# Patient Record
Sex: Female | Born: 1970
Health system: Southern US, Community
[De-identification: ages and names within clinical notes are randomized; demographics above are authoritative.]

## PROBLEM LIST (undated history)

## (undated) DIAGNOSIS — R221 Localized swelling, mass and lump, neck: Secondary | ICD-10-CM

## (undated) HISTORY — PX: BACK SURGERY: SHX140

---

## 2013-09-28 ENCOUNTER — Other Ambulatory Visit (HOSPITAL_COMMUNITY)
Admission: RE | Admit: 2013-09-28 | Discharge: 2013-09-28 | Disposition: A | Discharge status: Home / Self Care | Payer: BC Managed Care – PPO | Source: Ambulatory Visit | Attending: Family Medicine | Admitting: Family Medicine

## 2013-09-28 DIAGNOSIS — Z01419 Encounter for gynecological examination (general) (routine) without abnormal findings: Secondary | ICD-10-CM | POA: Insufficient documentation

## 2013-10-01 ENCOUNTER — Other Ambulatory Visit: Payer: Self-pay | Admitting: Family Medicine

## 2013-10-01 DIAGNOSIS — N63 Unspecified lump in unspecified breast: Secondary | ICD-10-CM

## 2013-10-01 DIAGNOSIS — Z803 Family history of malignant neoplasm of breast: Secondary | ICD-10-CM

## 2013-10-08 ENCOUNTER — Other Ambulatory Visit: Payer: Self-pay | Admitting: Family Medicine

## 2013-10-08 ENCOUNTER — Ambulatory Visit
Admission: RE | Admit: 2013-10-08 | Discharge: 2013-10-08 | Disposition: A | Discharge status: Home / Self Care | Payer: BC Managed Care – PPO | Source: Ambulatory Visit | Attending: Family Medicine | Admitting: Family Medicine

## 2013-10-08 DIAGNOSIS — N6320 Unspecified lump in the left breast, unspecified quadrant: Secondary | ICD-10-CM

## 2013-10-08 DIAGNOSIS — Z803 Family history of malignant neoplasm of breast: Secondary | ICD-10-CM

## 2013-10-08 DIAGNOSIS — N63 Unspecified lump in unspecified breast: Secondary | ICD-10-CM

## 2014-06-14 ENCOUNTER — Other Ambulatory Visit: Payer: Self-pay | Admitting: Family Medicine

## 2014-06-14 DIAGNOSIS — M545 Low back pain: Secondary | ICD-10-CM

## 2014-06-14 DIAGNOSIS — M5430 Sciatica, unspecified side: Secondary | ICD-10-CM

## 2014-06-22 ENCOUNTER — Ambulatory Visit
Admission: RE | Admit: 2014-06-22 | Discharge: 2014-06-22 | Disposition: A | Discharge status: Home / Self Care | Payer: BC Managed Care – PPO | Source: Ambulatory Visit | Attending: Family Medicine | Admitting: Family Medicine

## 2014-06-22 DIAGNOSIS — M545 Low back pain: Secondary | ICD-10-CM

## 2014-06-22 DIAGNOSIS — M5430 Sciatica, unspecified side: Secondary | ICD-10-CM

## 2014-10-07 ENCOUNTER — Other Ambulatory Visit: Payer: Self-pay

## 2014-10-07 DIAGNOSIS — Z1231 Encounter for screening mammogram for malignant neoplasm of breast: Secondary | ICD-10-CM

## 2014-10-10 ENCOUNTER — Encounter (INDEPENDENT_AMBULATORY_CARE_PROVIDER_SITE_OTHER): Payer: Self-pay

## 2014-10-10 ENCOUNTER — Ambulatory Visit
Admission: RE | Admit: 2014-10-10 | Discharge: 2014-10-10 | Disposition: A | Discharge status: Home / Self Care | Payer: BLUE CROSS/BLUE SHIELD | Source: Ambulatory Visit

## 2014-10-10 DIAGNOSIS — Z1231 Encounter for screening mammogram for malignant neoplasm of breast: Secondary | ICD-10-CM

## 2015-09-09 ENCOUNTER — Other Ambulatory Visit: Payer: Self-pay

## 2015-09-09 DIAGNOSIS — Z1231 Encounter for screening mammogram for malignant neoplasm of breast: Secondary | ICD-10-CM

## 2015-10-13 ENCOUNTER — Ambulatory Visit
Admission: RE | Admit: 2015-10-13 | Discharge: 2015-10-13 | Disposition: A | Discharge status: Home / Self Care | Payer: BLUE CROSS/BLUE SHIELD | Source: Ambulatory Visit

## 2015-10-13 DIAGNOSIS — Z1231 Encounter for screening mammogram for malignant neoplasm of breast: Secondary | ICD-10-CM

## 2016-07-27 DIAGNOSIS — R221 Localized swelling, mass and lump, neck: Secondary | ICD-10-CM | POA: Insufficient documentation

## 2016-08-24 ENCOUNTER — Other Ambulatory Visit: Payer: Self-pay | Admitting: Otolaryngology

## 2016-08-24 DIAGNOSIS — R221 Localized swelling, mass and lump, neck: Secondary | ICD-10-CM

## 2016-09-07 ENCOUNTER — Ambulatory Visit
Admission: RE | Admit: 2016-09-07 | Discharge: 2016-09-07 | Disposition: A | Discharge status: Home / Self Care | Payer: BLUE CROSS/BLUE SHIELD | Source: Ambulatory Visit | Attending: Otolaryngology | Admitting: Otolaryngology

## 2016-09-07 DIAGNOSIS — R221 Localized swelling, mass and lump, neck: Secondary | ICD-10-CM

## 2016-09-07 MED ORDER — IOPAMIDOL (ISOVUE-300) INJECTION 61%
75.0000 mL | Freq: Once | INTRAVENOUS | Status: AC | PRN
  Administered 2016-09-07: 75 mL via INTRAVENOUS

## 2016-09-20 ENCOUNTER — Other Ambulatory Visit: Payer: Self-pay | Admitting: Family Medicine

## 2016-09-20 DIAGNOSIS — Z1231 Encounter for screening mammogram for malignant neoplasm of breast: Secondary | ICD-10-CM

## 2016-09-24 ENCOUNTER — Other Ambulatory Visit: Payer: Self-pay | Admitting: Surgery

## 2016-09-24 DIAGNOSIS — R221 Localized swelling, mass and lump, neck: Secondary | ICD-10-CM

## 2016-09-29 ENCOUNTER — Ambulatory Visit (HOSPITAL_COMMUNITY)
Admission: RE | Admit: 2016-09-29 | Discharge: 2016-09-29 | Disposition: A | Discharge status: Home / Self Care | Payer: BLUE CROSS/BLUE SHIELD | Source: Ambulatory Visit | Attending: Surgery | Admitting: Surgery

## 2016-09-29 DIAGNOSIS — R221 Localized swelling, mass and lump, neck: Secondary | ICD-10-CM | POA: Diagnosis not present

## 2016-09-29 LAB — VAS US CAROTID
LEFT ECA DIAS: -9 cm/s
Left CCA dist dias: -24 cm/s
Left CCA dist sys: -115 cm/s
Left CCA prox dias: 36 cm/s
Left CCA prox sys: 146 cm/s
Left ICA dist dias: -27 cm/s
Left ICA dist sys: -81 cm/s
Left ICA prox dias: -20 cm/s
Left ICA prox sys: -61 cm/s
RIGHT CCA MID DIAS: 35 cm/s
RIGHT ECA DIAS: -14 cm/s
Right CCA prox dias: 28 cm/s
Right CCA prox sys: 104 cm/s
Right cca dist sys: -98 cm/s

## 2016-10-01 ENCOUNTER — Encounter: Payer: BLUE CROSS/BLUE SHIELD | Admitting: Surgery

## 2016-10-08 ENCOUNTER — Encounter: Payer: Self-pay | Admitting: Surgery

## 2016-10-13 ENCOUNTER — Encounter: Payer: Self-pay | Admitting: Surgery

## 2016-10-13 ENCOUNTER — Ambulatory Visit (INDEPENDENT_AMBULATORY_CARE_PROVIDER_SITE_OTHER): Payer: BLUE CROSS/BLUE SHIELD | Admitting: Surgery

## 2016-10-13 VITALS — BP 109/76 | HR 76 | Temp 97.0°F | Resp 18 | Ht 64.0 in | Wt 152.4 lb

## 2016-10-13 DIAGNOSIS — D446 Neoplasm of uncertain behavior of carotid body: Secondary | ICD-10-CM

## 2016-10-13 NOTE — Progress Notes (Signed)
[facility]  Patient name: Emily Rush MRN: [id number] DOB: July 20, 1971 Sex: female   REFERRING PROVIDER:    Dr. Redmond Baseman   REASON FOR CONSULT:    Carotid Body tummor  HISTORY OF PRESENT ILLNESS:   Emily Rush is a 46 y.o. female, who is here today for evaluation of a right sided carotid body tumor.  The patient first noticed a mass approximately 1 year ago.  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  She is here today to discuss removal.  She denies any symptoms.    PAST MEDICAL HISTORY   Lower back pain, s/p microdiscectomy (L5-S1) Varicose Veins, Bilateral LE  FAMILY HISTORY   Dad, brother, Type 2 DM NO CAD  SOCIAL HISTORY:   Social History   Social History  . Marital status: Single    Spouse name: N/A  . Number of children: N/A  . Years of education: N/A   Occupational History  . Not on file.   Social History Main Topics  . Smoking status: Former Smoker    Quit date: 10/13/1996  . Smokeless tobacco: Never Used  . Alcohol use Yes  . Drug use: No  . Sexual activity: Not on file   Other Topics Concern  . Not on file   Social History Narrative  . No narrative on file    ALLERGIES:    No Known Allergies  CURRENT MEDICATIONS:   Multi vitamins   REVIEW OF SYSTEMS:   [X]  denotes positive finding, [ ]  denotes negative finding Cardiac  Comments:  Chest pain or chest pressure:    Shortness of breath upon exertion:    Short of breath when lying flat:    Irregular heart rhythm:        Vascular    Pain in calf, thigh, or hip brought on by ambulation:    Pain in feet at night that wakes you up from your sleep:     Blood clot in your veins:    Leg swelling:         Pulmonary    Oxygen at home:    Productive cough:     Wheezing:         Neurologic    Sudden weakness in arms or legs:     Sudden  numbness in arms or legs:     Sudden onset of difficulty speaking or slurred speech:    Temporary loss of vision in one eye:     Problems with dizziness:         Gastrointestinal    Blood in stool:      Vomited blood:         Genitourinary    Burning when urinating:     Blood in urine:        Psychiatric    Major depression:         Hematologic    Bleeding problems:    Problems with blood clotting too easily:        Skin    Rashes or ulcers:        Constitutional    Fever or chills:     PHYSICAL EXAM:   Vitals:   10/13/16 0859  BP: 109/76  Pulse: 76  Resp: 18  Temp: 97 F (36.1 C)  TempSrc: Oral  SpO2: 98%  Weight: 152 lb 6.4 oz (69.1 kg)  Height: 5\' 4"  (1.626  m)    GENERAL: The patient is a well-nourished female, in no acute distress. The vital signs are documented above. CARDIAC: There is a regular rate and rhythm.  VASCULAR: No carotid bruit.  Nonmobile mass in the right neck PULMONARY: Nonlabored respirations  MUSCULOSKELETAL: There are no major deformities or cyanosis. NEUROLOGIC: No focal weakness or paresthesias are detected. SKIN: There are no ulcers or rashes noted. PSYCHIATRIC: The patient has a normal affect.  STUDIES:   I have reviewed her CT scan which shows a right-sided paraganglioma measuring approximately 3 x 2 cm  ASSESSMENT and PLAN   Right carotid body tumor: On review of her images, this tumor is very high.  Potentially additional maneuvers may need to be undertaken in order to resect the entire mass.  I discussed with the patient the possibility of nerve injury to the glossopharyngeal, hypoglossal, vagus, and marginal mandibular nerves.  We also discussed the need for preoperative embolization.  The patient is scheduled to see Dr. Redmond Baseman in the next week or 2 for further discussions.  I will try to discuss with Dr. Redmond Baseman my concerns regarding her operation in the meantime.   Annamarie Major, MD [facility] [phone] Pager [phone]

## 2016-10-14 ENCOUNTER — Ambulatory Visit
Admission: RE | Admit: 2016-10-14 | Discharge: 2016-10-14 | Disposition: A | Discharge status: Home / Self Care | Payer: BLUE CROSS/BLUE SHIELD | Source: Ambulatory Visit | Attending: Family Medicine | Admitting: Family Medicine

## 2016-10-14 DIAGNOSIS — Z1231 Encounter for screening mammogram for malignant neoplasm of breast: Secondary | ICD-10-CM

## 2016-10-21 DIAGNOSIS — D446 Neoplasm of uncertain behavior of carotid body: Secondary | ICD-10-CM | POA: Insufficient documentation

## 2017-04-08 ENCOUNTER — Other Ambulatory Visit: Payer: Self-pay | Admitting: Otolaryngology

## 2017-04-08 DIAGNOSIS — D446 Neoplasm of uncertain behavior of carotid body: Secondary | ICD-10-CM

## 2017-04-11 ENCOUNTER — Other Ambulatory Visit: Payer: BLUE CROSS/BLUE SHIELD

## 2017-08-17 DIAGNOSIS — B349 Viral infection, unspecified: Secondary | ICD-10-CM | POA: Diagnosis not present

## 2017-09-09 ENCOUNTER — Other Ambulatory Visit: Payer: Self-pay | Admitting: Family Medicine

## 2017-09-09 DIAGNOSIS — Z139 Encounter for screening, unspecified: Secondary | ICD-10-CM

## 2017-10-17 ENCOUNTER — Ambulatory Visit
Admission: RE | Admit: 2017-10-17 | Discharge: 2017-10-17 | Disposition: A | Discharge status: Home / Self Care | Payer: BLUE CROSS/BLUE SHIELD | Source: Ambulatory Visit | Attending: Family Medicine | Admitting: Family Medicine

## 2017-10-17 DIAGNOSIS — Z139 Encounter for screening, unspecified: Secondary | ICD-10-CM

## 2017-10-17 DIAGNOSIS — Z1231 Encounter for screening mammogram for malignant neoplasm of breast: Secondary | ICD-10-CM | POA: Diagnosis not present

## 2017-10-24 DIAGNOSIS — Z0000 Encounter for general adult medical examination without abnormal findings: Secondary | ICD-10-CM | POA: Diagnosis not present

## 2017-10-24 DIAGNOSIS — Z13220 Encounter for screening for lipoid disorders: Secondary | ICD-10-CM | POA: Diagnosis not present

## 2018-02-16 DIAGNOSIS — Z01419 Encounter for gynecological examination (general) (routine) without abnormal findings: Secondary | ICD-10-CM | POA: Diagnosis not present

## 2018-09-13 ENCOUNTER — Other Ambulatory Visit: Payer: Self-pay | Admitting: Family Medicine

## 2018-09-13 DIAGNOSIS — Z1231 Encounter for screening mammogram for malignant neoplasm of breast: Secondary | ICD-10-CM

## 2018-10-18 ENCOUNTER — Ambulatory Visit
Admission: RE | Admit: 2018-10-18 | Discharge: 2018-10-18 | Disposition: A | Discharge status: Home / Self Care | Payer: 59 | Source: Ambulatory Visit | Attending: Family Medicine | Admitting: Family Medicine

## 2018-10-18 DIAGNOSIS — Z1231 Encounter for screening mammogram for malignant neoplasm of breast: Secondary | ICD-10-CM | POA: Diagnosis not present

## 2018-11-08 DIAGNOSIS — Z13220 Encounter for screening for lipoid disorders: Secondary | ICD-10-CM | POA: Diagnosis not present

## 2018-11-08 DIAGNOSIS — Z0000 Encounter for general adult medical examination without abnormal findings: Secondary | ICD-10-CM | POA: Diagnosis not present

## 2019-09-14 ENCOUNTER — Other Ambulatory Visit: Payer: Self-pay | Admitting: Family Medicine

## 2019-09-14 DIAGNOSIS — Z1231 Encounter for screening mammogram for malignant neoplasm of breast: Secondary | ICD-10-CM

## 2019-10-08 DIAGNOSIS — N3000 Acute cystitis without hematuria: Secondary | ICD-10-CM | POA: Diagnosis not present

## 2019-10-23 ENCOUNTER — Ambulatory Visit
Admission: RE | Admit: 2019-10-23 | Discharge: 2019-10-23 | Disposition: A | Discharge status: Home / Self Care | Payer: Self-pay | Source: Ambulatory Visit | Attending: Family Medicine | Admitting: Family Medicine

## 2019-10-23 ENCOUNTER — Ambulatory Visit: Payer: 59

## 2019-10-23 ENCOUNTER — Other Ambulatory Visit: Payer: Self-pay

## 2019-10-23 DIAGNOSIS — Z1231 Encounter for screening mammogram for malignant neoplasm of breast: Secondary | ICD-10-CM

## 2019-11-30 ENCOUNTER — Other Ambulatory Visit: Payer: BC Managed Care – PPO

## 2019-12-03 ENCOUNTER — Ambulatory Visit: Payer: BC Managed Care – PPO | Attending: Internal Medicine

## 2019-12-03 DIAGNOSIS — Z23 Encounter for immunization: Secondary | ICD-10-CM

## 2019-12-03 NOTE — Progress Notes (Signed)
   Covid-19 Vaccination Clinic  Name:  Tamesa Dallis    MRN: [id number] DOB: 1971-06-23  12/03/2019  Ms. Soman was observed post Covid-19 immunization for 15 minutes without incident. She was provided with Vaccine Information Sheet and instruction to access the V-Safe system.   Ms. Dalto was instructed to call 911 with any severe reactions post vaccine: Marland Kitchen Difficulty breathing  . Swelling of face and throat  . A fast heartbeat  . A bad rash all over body  . Dizziness and weakness   Immunizations Administered    Name Date Dose VIS Date Route   Pfizer COVID-19 Vaccine 12/03/2019 12:35 PM 0.3 mL 08/17/2019 Intramuscular   Manufacturer: [place]   Lot: [id number]   [place]: [id number]

## 2019-12-24 DIAGNOSIS — Z0000 Encounter for general adult medical examination without abnormal findings: Secondary | ICD-10-CM | POA: Diagnosis not present

## 2019-12-24 DIAGNOSIS — R221 Localized swelling, mass and lump, neck: Secondary | ICD-10-CM | POA: Diagnosis not present

## 2019-12-24 DIAGNOSIS — Z13220 Encounter for screening for lipoid disorders: Secondary | ICD-10-CM | POA: Diagnosis not present

## 2019-12-26 ENCOUNTER — Ambulatory Visit: Payer: BC Managed Care – PPO | Attending: Internal Medicine

## 2019-12-26 DIAGNOSIS — Z23 Encounter for immunization: Secondary | ICD-10-CM

## 2019-12-26 NOTE — Progress Notes (Signed)
   Covid-19 Vaccination Clinic  Name:  Emily Rush    MRN: [id number] DOB: 01/07/1971  12/26/2019  [name] was observed post Covid-19 immunization for 15 minutes without incident. She was provided with Vaccine Information Sheet and instruction to access the V-Safe system.   [name] was instructed to call 911 with any severe reactions post vaccine: Marland Kitchen Difficulty breathing  . Swelling of face and throat  . A fast heartbeat  . A bad rash all over body  . Dizziness and weakness   Immunizations Administered    Name Date Dose VIS Date Route   Pfizer COVID-19 Vaccine 12/26/2019 12:28 PM 0.3 mL 10/31/2018 Intramuscular   Manufacturer: [place]   Lot: [id number]   [place]: [id number]

## 2020-06-03 DIAGNOSIS — Z20822 Contact with and (suspected) exposure to covid-19: Secondary | ICD-10-CM | POA: Diagnosis not present

## 2020-09-03 DIAGNOSIS — Z01419 Encounter for gynecological examination (general) (routine) without abnormal findings: Secondary | ICD-10-CM | POA: Diagnosis not present

## 2020-10-03 ENCOUNTER — Other Ambulatory Visit: Payer: Self-pay | Admitting: Family Medicine

## 2020-10-03 DIAGNOSIS — Z1231 Encounter for screening mammogram for malignant neoplasm of breast: Secondary | ICD-10-CM

## 2020-11-19 ENCOUNTER — Ambulatory Visit
Admission: RE | Admit: 2020-11-19 | Discharge: 2020-11-19 | Disposition: A | Discharge status: Home / Self Care | Payer: BC Managed Care – PPO | Source: Ambulatory Visit | Attending: Family Medicine | Admitting: Family Medicine

## 2020-11-19 ENCOUNTER — Other Ambulatory Visit: Payer: Self-pay

## 2020-11-19 DIAGNOSIS — Z1231 Encounter for screening mammogram for malignant neoplasm of breast: Secondary | ICD-10-CM | POA: Diagnosis not present

## 2021-02-13 DIAGNOSIS — Z23 Encounter for immunization: Secondary | ICD-10-CM | POA: Diagnosis not present

## 2021-02-13 DIAGNOSIS — R221 Localized swelling, mass and lump, neck: Secondary | ICD-10-CM | POA: Diagnosis not present

## 2021-02-13 DIAGNOSIS — Z0000 Encounter for general adult medical examination without abnormal findings: Secondary | ICD-10-CM | POA: Diagnosis not present

## 2021-02-13 DIAGNOSIS — Z13220 Encounter for screening for lipoid disorders: Secondary | ICD-10-CM | POA: Diagnosis not present

## 2021-03-27 DIAGNOSIS — D446 Neoplasm of uncertain behavior of carotid body: Secondary | ICD-10-CM | POA: Diagnosis not present

## 2021-03-31 DIAGNOSIS — D446 Neoplasm of uncertain behavior of carotid body: Secondary | ICD-10-CM | POA: Diagnosis not present

## 2021-04-17 DIAGNOSIS — Z23 Encounter for immunization: Secondary | ICD-10-CM | POA: Diagnosis not present

## 2021-04-22 DIAGNOSIS — D333 Benign neoplasm of cranial nerves: Secondary | ICD-10-CM | POA: Insufficient documentation

## 2021-04-24 DIAGNOSIS — R221 Localized swelling, mass and lump, neck: Secondary | ICD-10-CM | POA: Diagnosis not present

## 2021-08-21 DIAGNOSIS — Z01419 Encounter for gynecological examination (general) (routine) without abnormal findings: Secondary | ICD-10-CM | POA: Diagnosis not present

## 2021-09-10 DIAGNOSIS — N939 Abnormal uterine and vaginal bleeding, unspecified: Secondary | ICD-10-CM | POA: Diagnosis not present

## 2021-09-18 DIAGNOSIS — Z30432 Encounter for removal of intrauterine contraceptive device: Secondary | ICD-10-CM | POA: Diagnosis not present

## 2021-09-18 DIAGNOSIS — T8331XA Breakdown (mechanical) of intrauterine contraceptive device, initial encounter: Secondary | ICD-10-CM | POA: Diagnosis not present

## 2021-09-23 DIAGNOSIS — F411 Generalized anxiety disorder: Secondary | ICD-10-CM | POA: Diagnosis not present

## 2021-09-23 DIAGNOSIS — T8331XD Breakdown (mechanical) of intrauterine contraceptive device, subsequent encounter: Secondary | ICD-10-CM | POA: Diagnosis not present

## 2021-09-30 DIAGNOSIS — Z3202 Encounter for pregnancy test, result negative: Secondary | ICD-10-CM | POA: Diagnosis not present

## 2021-09-30 DIAGNOSIS — T8331XD Breakdown (mechanical) of intrauterine contraceptive device, subsequent encounter: Secondary | ICD-10-CM | POA: Diagnosis not present

## 2021-10-05 ENCOUNTER — Encounter (HOSPITAL_COMMUNITY): Payer: Self-pay | Admitting: Anesthesiology

## 2021-10-05 ENCOUNTER — Other Ambulatory Visit: Payer: Self-pay

## 2021-10-05 ENCOUNTER — Encounter (HOSPITAL_BASED_OUTPATIENT_CLINIC_OR_DEPARTMENT_OTHER): Payer: Self-pay | Admitting: Obstetrics and Gynecology

## 2021-10-05 NOTE — Anesthesia Preprocedure Evaluation (Deleted)
Anesthesia Evaluation    Airway        Dental   Pulmonary former smoker,           Cardiovascular      Neuro/Psych    GI/Hepatic   Endo/Other    Renal/GU      Musculoskeletal   Abdominal   Peds  Hematology   Anesthesia Other Findings   Reproductive/Obstetrics                             Anesthesia Physical Anesthesia Plan  ASA:   Anesthesia Plan:    Post-op Pain Management:    Induction:   PONV Risk Score and Plan:   Airway Management Planned:   Additional Equipment:   Intra-op Plan:   Post-operative Plan:   Informed Consent:   Plan Discussed with:   Anesthesia Plan Comments: (ENT: Mass is stable with no symptoms.  Mass in the right neck carotid space is not substantially changed from the prior exam dated 09/07/2016. Favor vagal schwannoma. A glomus vagale paraganglioma is considered less likely since the tumor is centered almost 4 cm below the jugular foramen, and there are internal areas of cystic change in the lesion as can be seen with schwannomas.)        Anesthesia Quick Evaluation

## 2021-10-05 NOTE — Progress Notes (Signed)
Chart reviewed with Dr. Smith Robert.Ok to proceed with surgery as scheduled at [facility].

## 2021-10-06 NOTE — H&P (Signed)
Emily Rush is an 51 y.o. G0 who is admitted for Hysteroscopic removal of retained Paragard IUD arm.  Patient presented to the office on 09/18/21 for Paragard IUD removal due to malposition (IUD located within lower uterine segment/cervix). Upon removal, there was an IUD arm retained. In office hysteroscopic removal was attempted on 09/30/21 without success. The IUD arm located on the right of the cervix.  She does not have any current pelvic pain or abnormal bleeding.  Patient Active Problem List   Diagnosis Date Noted   Abnormal uterine bleeding 10/12/2021   Acute stress disorder 10/12/2021   Anxiety 10/12/2021   Encounter for contraceptive management, unspecified 10/12/2021   Asthma 94/85/4627   IUD complication ([place]) 03/50/0938   Schwannoma of cranial nerve ([place]) 04/22/2021   Carotid paraganglioma ([place]) 10/21/2016   Mass of right side of neck 07/27/2016    MEDICAL/FAMILY/SOCIAL HX: Patient's last menstrual period was 09/11/2021.    Past Medical History:  Diagnosis Date   Mass of right side of neck     Past Surgical History:  Procedure Laterality Date   BACK SURGERY      Family History  Problem Relation Age of Onset   Breast cancer Mother     Social History:  reports that she quit smoking about 24 years ago. Her smoking use included cigarettes. She has never used smokeless tobacco. She reports current alcohol use. She reports that she does not use drugs.  ALLERGIES/MEDS:  Allergies: No Known Allergies  No medications prior to admission.     Review of Systems  Constitutional: Negative.   HENT: Negative.    Eyes: Negative.   Respiratory: Negative.    Cardiovascular: Negative.   Gastrointestinal: Negative.   Genitourinary: Negative.   Musculoskeletal: Negative.   Skin: Negative.   Neurological: Negative.   Endo/Heme/Allergies: Negative.   Psychiatric/Behavioral: Negative.     Height 5\' 4"  (1.626 m), weight 65.8 kg, last menstrual period 09/11/2021. Gen:   NAD, pleasant and cooperative Cardio:  RRR Pulm:  CTAB, no wheezes/rales/rhonchi Abd:  Soft, non-distended, non-tender throughout, no rebound/guarding Ext:  No bilateral LE edema, no bilateral calf tenderness Pelvic: Labia - unremarkable, vagina - pink moist mucosa, no lesions or abnormal discharge, cervix - no discharge or lesions or CMT  No results found for this or any previous visit (from the past 24 hour(s)).  No results found.   ASSESSMENT/PLAN: Emily Rush is a 51 y.o. G0 who is admitted for Hysteroscopic removal of retained Paragard IUD arm.  - Admit to [facility] - Admit labs (CBC, T&S, COVID screen) - Diet:  Per anesthesia/ERAS pathway - IVF:  Per anesthesia - VTE Prophylaxis:  SCDs - Antibiotics: None - Anticipate D/C home same-day  Consents: I discussed with the patient that this surgery is performed to look inside the uterus and remove the retained IUD arm.  Prior to surgery, the risks and benefits of the surgery, as well as alternative treatments, have been discussed.  The risks include, but are not limited to bleeding, including the need for a blood transfusion, infection, damage to organs and tissues, including uterine perforation, requiring additional surgery, postoperative pain, short-term and long-term, failure of the procedure to control symptoms, need for hysterectomy to control bleeding, fluid overload, which could create electrolyte abnormalities and the need to stop the procedure before completion, inability to safely complete the procedure, deep vein thrombosis and/or pulmonary embolism, painful intercourse, complications the course of which cannot be predicted or prevented, and death.  Patient was consented for blood  products.  The patient is aware that bleeding may result in the need for a blood transfusion which includes risk of transmission of HIV (1:2 million), Hepatitis C (1:2 million), and Hepatitis B (1:200 thousand) and transfusion reaction.  Patient voiced  understanding of the above risks as well as understanding of indications for blood transfusion.  Drema Dallas, DO [phone] (office)

## 2021-10-12 ENCOUNTER — Encounter (HOSPITAL_BASED_OUTPATIENT_CLINIC_OR_DEPARTMENT_OTHER)
Admission: RE | Admit: 2021-10-12 | Discharge: 2021-10-12 | Disposition: A | Discharge status: Home / Self Care | Payer: BC Managed Care – PPO | Source: Ambulatory Visit | Attending: Obstetrics and Gynecology | Admitting: Obstetrics and Gynecology

## 2021-10-12 DIAGNOSIS — T839XXA Unspecified complication of genitourinary prosthetic device, implant and graft, initial encounter: Secondary | ICD-10-CM

## 2021-10-12 DIAGNOSIS — Z01812 Encounter for preprocedural laboratory examination: Secondary | ICD-10-CM | POA: Insufficient documentation

## 2021-10-12 DIAGNOSIS — F419 Anxiety disorder, unspecified: Secondary | ICD-10-CM | POA: Insufficient documentation

## 2021-10-12 DIAGNOSIS — Z309 Encounter for contraceptive management, unspecified: Secondary | ICD-10-CM | POA: Insufficient documentation

## 2021-10-12 DIAGNOSIS — J45909 Unspecified asthma, uncomplicated: Secondary | ICD-10-CM

## 2021-10-12 DIAGNOSIS — F430 Acute stress reaction: Secondary | ICD-10-CM | POA: Insufficient documentation

## 2021-10-12 DIAGNOSIS — N939 Abnormal uterine and vaginal bleeding, unspecified: Secondary | ICD-10-CM | POA: Insufficient documentation

## 2021-10-12 LAB — CBC
HCT: 40.3 % (ref 36.0–46.0)
Hemoglobin: 13.9 g/dL (ref 12.0–15.0)
MCH: 31.2 pg (ref 26.0–34.0)
MCHC: 34.5 g/dL (ref 30.0–36.0)
MCV: 90.6 fL (ref 80.0–100.0)
Platelets: 289 10*3/uL (ref 150–400)
RBC: 4.45 MIL/uL (ref 3.87–5.11)
RDW: 12.4 % (ref 11.5–15.5)
WBC: 6.7 10*3/uL (ref 4.0–10.5)
nRBC: 0 % (ref 0.0–0.2)

## 2021-10-12 LAB — TYPE AND SCREEN
ABO/RH(D): O POS
Antibody Screen: NEGATIVE

## 2021-10-12 LAB — POCT PREGNANCY, URINE: Preg Test, Ur: NEGATIVE

## 2021-10-12 NOTE — Progress Notes (Signed)

## 2021-10-14 ENCOUNTER — Ambulatory Visit (HOSPITAL_BASED_OUTPATIENT_CLINIC_OR_DEPARTMENT_OTHER)
Admission: RE | Admit: 2021-10-14 | Payer: BC Managed Care – PPO | Source: Home / Self Care | Admitting: Obstetrics and Gynecology

## 2021-10-14 DIAGNOSIS — T8331XD Breakdown (mechanical) of intrauterine contraceptive device, subsequent encounter: Secondary | ICD-10-CM

## 2021-10-14 DIAGNOSIS — T839XXA Unspecified complication of genitourinary prosthetic device, implant and graft, initial encounter: Secondary | ICD-10-CM

## 2021-10-14 DIAGNOSIS — J45909 Unspecified asthma, uncomplicated: Secondary | ICD-10-CM

## 2021-10-14 HISTORY — DX: Localized swelling, mass and lump, neck: R22.1

## 2021-10-14 SURGERY — HYSTEROSCOPY
Anesthesia: Choice

## 2021-10-19 ENCOUNTER — Other Ambulatory Visit: Payer: Self-pay | Admitting: Family Medicine

## 2021-10-19 DIAGNOSIS — Z1231 Encounter for screening mammogram for malignant neoplasm of breast: Secondary | ICD-10-CM

## 2021-11-02 DIAGNOSIS — T8331XD Breakdown (mechanical) of intrauterine contraceptive device, subsequent encounter: Secondary | ICD-10-CM | POA: Diagnosis not present

## 2021-11-02 DIAGNOSIS — F411 Generalized anxiety disorder: Secondary | ICD-10-CM | POA: Diagnosis not present

## 2021-11-20 ENCOUNTER — Ambulatory Visit: Payer: BC Managed Care – PPO

## 2021-12-08 DIAGNOSIS — Z30432 Encounter for removal of intrauterine contraceptive device: Secondary | ICD-10-CM | POA: Diagnosis not present

## 2021-12-08 DIAGNOSIS — N841 Polyp of cervix uteri: Secondary | ICD-10-CM | POA: Diagnosis not present

## 2021-12-31 DIAGNOSIS — D447 Neoplasm of uncertain behavior of aortic body and other paraganglia: Secondary | ICD-10-CM | POA: Diagnosis not present

## 2021-12-31 DIAGNOSIS — Z6826 Body mass index (BMI) 26.0-26.9, adult: Secondary | ICD-10-CM | POA: Diagnosis not present

## 2022-01-01 ENCOUNTER — Ambulatory Visit
Admission: RE | Admit: 2022-01-01 | Discharge: 2022-01-01 | Disposition: A | Discharge status: Home / Self Care | Payer: BC Managed Care – PPO | Source: Ambulatory Visit | Attending: Family Medicine | Admitting: Family Medicine

## 2022-01-01 DIAGNOSIS — Z1231 Encounter for screening mammogram for malignant neoplasm of breast: Secondary | ICD-10-CM | POA: Diagnosis not present

## 2022-01-11 IMAGING — MG DIGITAL SCREENING BILAT W/ TOMO W/ CAD
8 series · 8 of 24 positions shown · non-contrast
Comparison: Previous exam(s).

CLINICAL DATA: Screening.

EXAM:
DIGITAL SCREENING BILATERAL MAMMOGRAM WITH TOMO AND CAD

[L CC synth-2D]
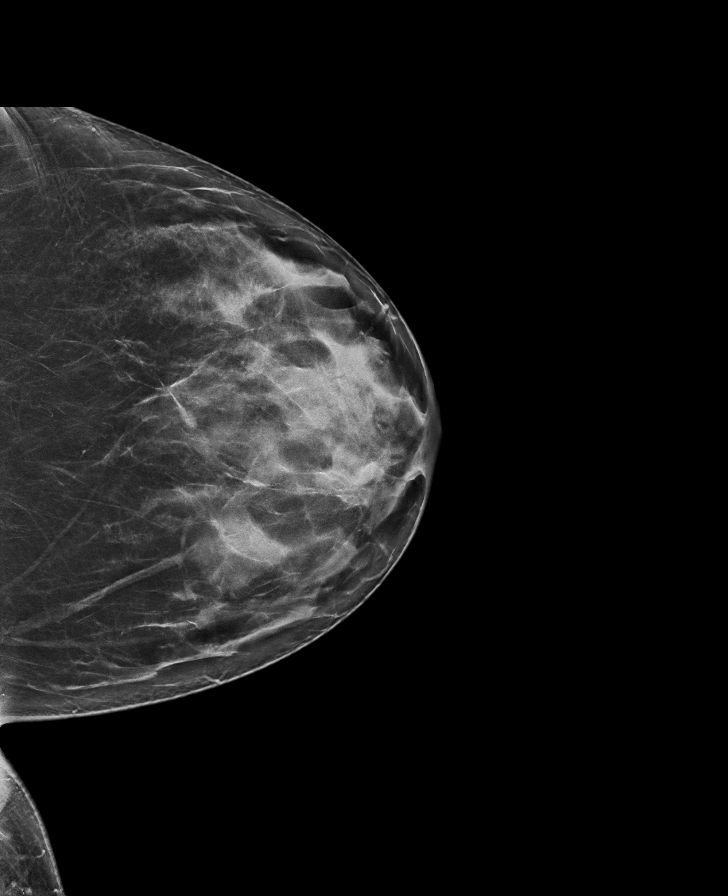

[R CC synth-2D]
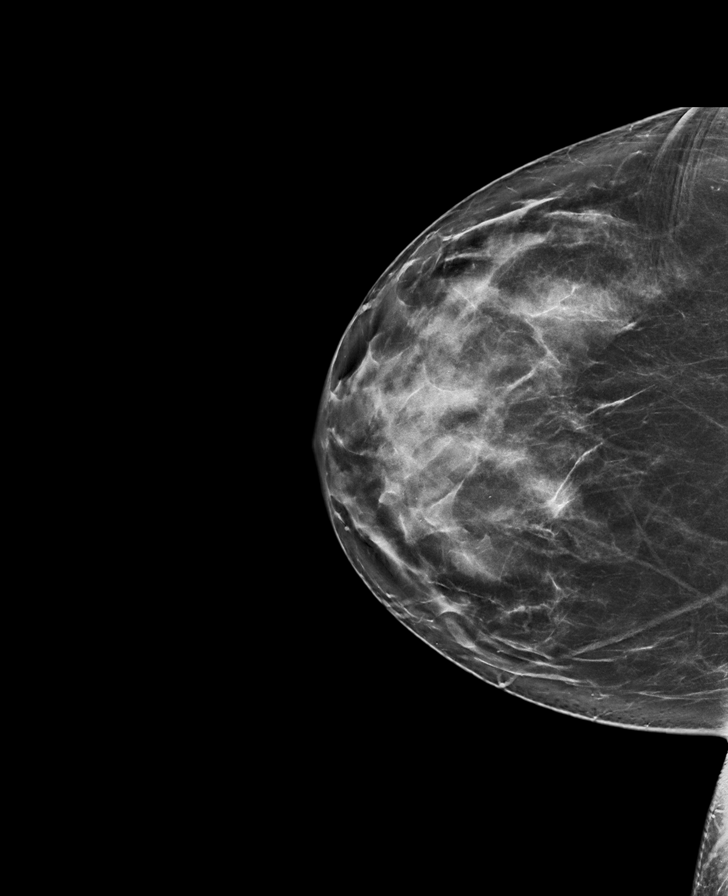

[R MLO synth-2D]
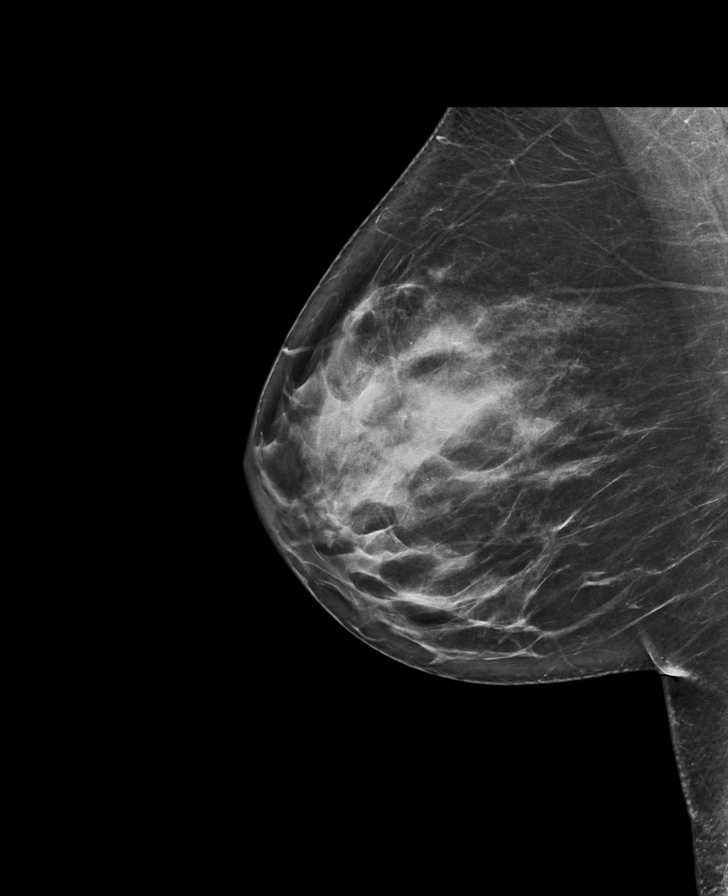

[L MLO synth-2D]
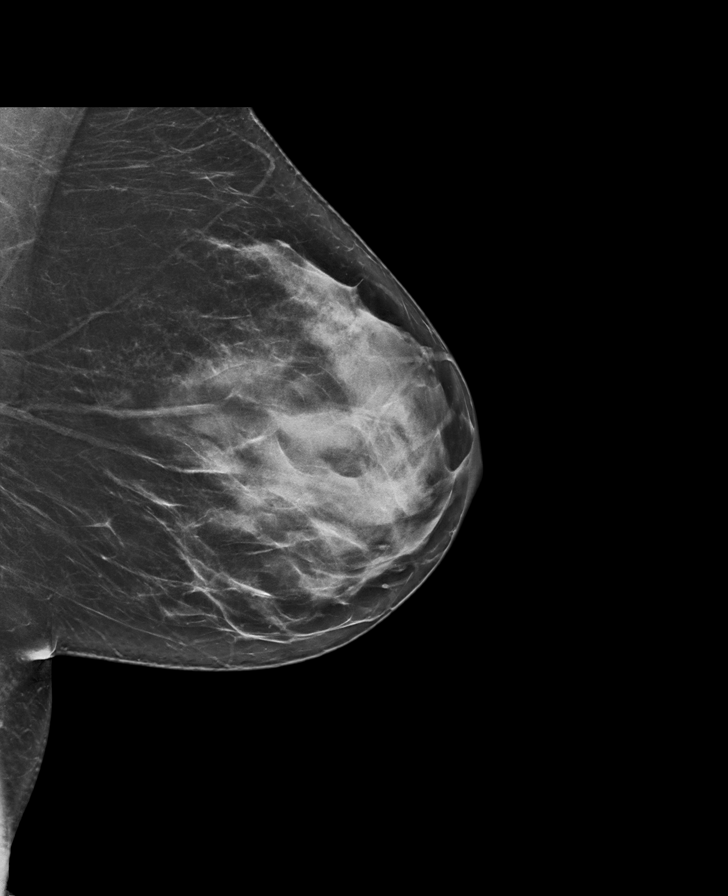

[R MLO tomo · tomo slice 41/81.0]
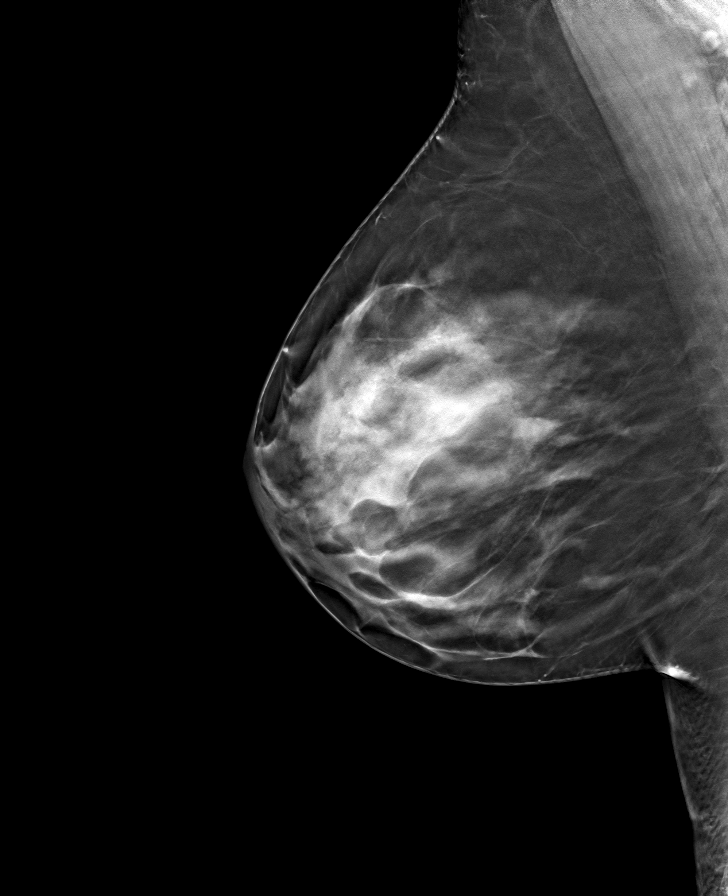

[L MLO tomo · tomo slice 39/77.0]
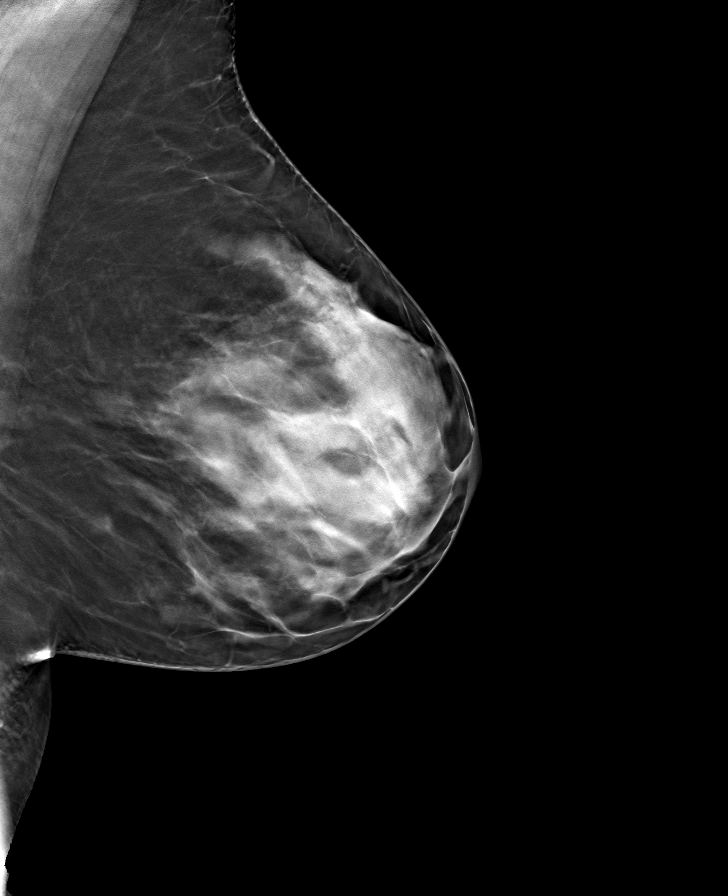

[R CC tomo · tomo slice 42/83.0]
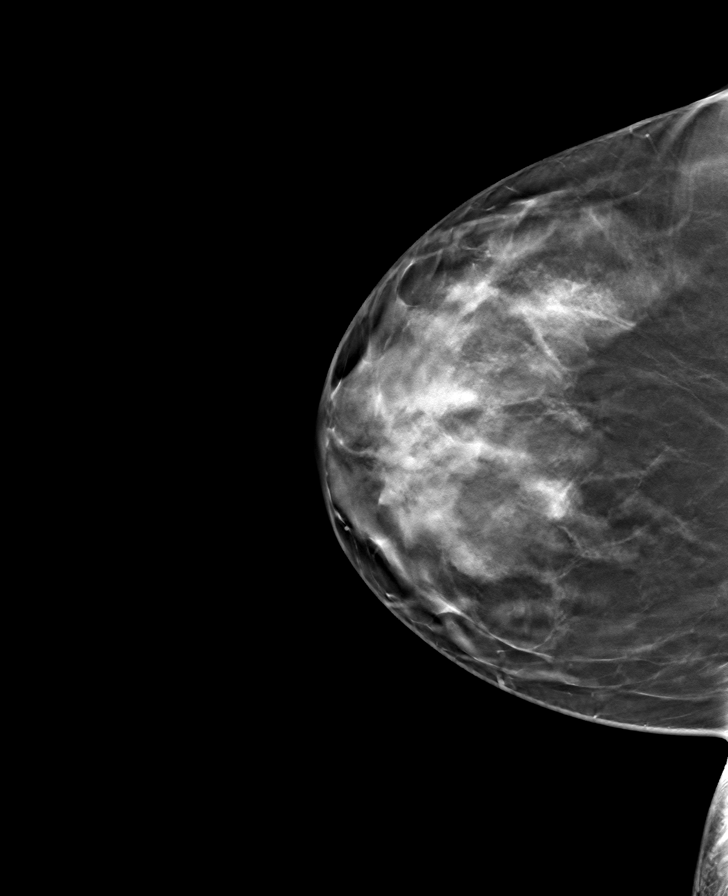

[L CC tomo · tomo slice 39/78.0]
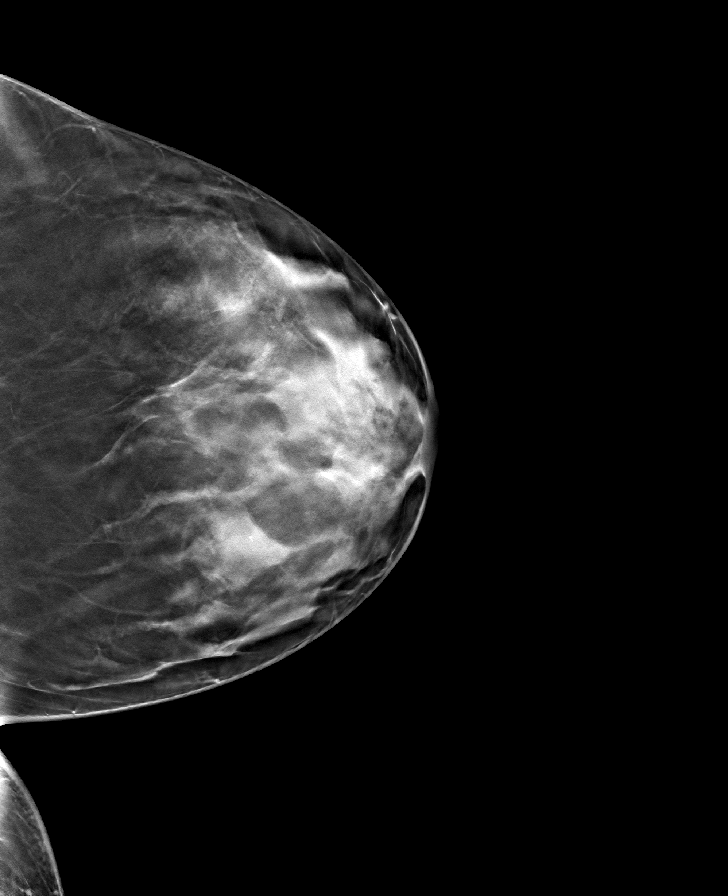

[8 of 24 positions shown; findings below may reference images not displayed]

ACR Breast Density Category c: The breast tissue is heterogeneously
dense, which may obscure small masses.
FINDINGS: There are no findings suspicious for malignancy. Images were
processed with CAD.
IMPRESSION: No mammographic evidence of malignancy. A result letter of this
screening mammogram will be mailed directly to the patient.

RECOMMENDATION:
Screening mammogram in one year. (Code:[id number]-[id number])

BI-RADS CATEGORY  1: Negative.

## 2022-03-26 DIAGNOSIS — Z13220 Encounter for screening for lipoid disorders: Secondary | ICD-10-CM | POA: Diagnosis not present

## 2022-03-26 DIAGNOSIS — R221 Localized swelling, mass and lump, neck: Secondary | ICD-10-CM | POA: Diagnosis not present

## 2022-03-26 DIAGNOSIS — N920 Excessive and frequent menstruation with regular cycle: Secondary | ICD-10-CM | POA: Diagnosis not present

## 2022-03-26 DIAGNOSIS — D446 Neoplasm of uncertain behavior of carotid body: Secondary | ICD-10-CM | POA: Diagnosis not present

## 2022-03-26 DIAGNOSIS — Z0000 Encounter for general adult medical examination without abnormal findings: Secondary | ICD-10-CM | POA: Diagnosis not present

## 2022-05-24 DIAGNOSIS — K649 Unspecified hemorrhoids: Secondary | ICD-10-CM | POA: Diagnosis not present

## 2022-05-24 DIAGNOSIS — K5730 Diverticulosis of large intestine without perforation or abscess without bleeding: Secondary | ICD-10-CM | POA: Diagnosis not present

## 2022-05-24 DIAGNOSIS — Z1211 Encounter for screening for malignant neoplasm of colon: Secondary | ICD-10-CM | POA: Diagnosis not present

## 2022-05-24 DIAGNOSIS — Z8371 Family history of colonic polyps: Secondary | ICD-10-CM | POA: Diagnosis not present

## 2022-10-01 DIAGNOSIS — Z01419 Encounter for gynecological examination (general) (routine) without abnormal findings: Secondary | ICD-10-CM | POA: Diagnosis not present

## 2022-10-01 DIAGNOSIS — Z124 Encounter for screening for malignant neoplasm of cervix: Secondary | ICD-10-CM | POA: Diagnosis not present

## 2022-12-31 ENCOUNTER — Other Ambulatory Visit: Payer: Self-pay | Admitting: Family Medicine

## 2022-12-31 DIAGNOSIS — Z1231 Encounter for screening mammogram for malignant neoplasm of breast: Secondary | ICD-10-CM

## 2023-02-04 ENCOUNTER — Ambulatory Visit
Admission: RE | Admit: 2023-02-04 | Discharge: 2023-02-04 | Disposition: A | Discharge status: Home / Self Care | Payer: BC Managed Care – PPO | Source: Ambulatory Visit | Attending: Family Medicine | Admitting: Family Medicine

## 2023-02-04 DIAGNOSIS — Z1231 Encounter for screening mammogram for malignant neoplasm of breast: Secondary | ICD-10-CM

## 2023-03-30 DIAGNOSIS — D446 Neoplasm of uncertain behavior of carotid body: Secondary | ICD-10-CM | POA: Diagnosis not present

## 2023-03-30 DIAGNOSIS — Z0000 Encounter for general adult medical examination without abnormal findings: Secondary | ICD-10-CM | POA: Diagnosis not present

## 2023-10-05 DIAGNOSIS — Z01419 Encounter for gynecological examination (general) (routine) without abnormal findings: Secondary | ICD-10-CM | POA: Diagnosis not present

## 2024-01-03 ENCOUNTER — Other Ambulatory Visit: Payer: Self-pay | Admitting: Family Medicine

## 2024-01-03 DIAGNOSIS — Z1231 Encounter for screening mammogram for malignant neoplasm of breast: Secondary | ICD-10-CM

## 2024-02-16 ENCOUNTER — Ambulatory Visit

## 2024-02-23 ENCOUNTER — Ambulatory Visit
Admission: RE | Admit: 2024-02-23 | Discharge: 2024-02-23 | Disposition: A | Discharge status: Home / Self Care | Source: Ambulatory Visit | Attending: Family Medicine | Admitting: Family Medicine

## 2024-02-23 DIAGNOSIS — Z1231 Encounter for screening mammogram for malignant neoplasm of breast: Secondary | ICD-10-CM

## 2024-04-02 DIAGNOSIS — D446 Neoplasm of uncertain behavior of carotid body: Secondary | ICD-10-CM | POA: Diagnosis not present

## 2024-04-02 DIAGNOSIS — R600 Localized edema: Secondary | ICD-10-CM | POA: Diagnosis not present

## 2024-04-02 DIAGNOSIS — Z0000 Encounter for general adult medical examination without abnormal findings: Secondary | ICD-10-CM | POA: Diagnosis not present

## 2024-04-05 ENCOUNTER — Other Ambulatory Visit (HOSPITAL_COMMUNITY): Payer: Self-pay | Admitting: Family Medicine

## 2024-04-05 ENCOUNTER — Ambulatory Visit (HOSPITAL_COMMUNITY)
Admission: RE | Admit: 2024-04-05 | Discharge: 2024-04-05 | Disposition: A | Discharge status: Home / Self Care | Source: Ambulatory Visit | Attending: Vascular Surgery | Admitting: Vascular Surgery

## 2024-04-05 DIAGNOSIS — R609 Edema, unspecified: Secondary | ICD-10-CM | POA: Diagnosis not present

## 2024-05-18 DIAGNOSIS — E039 Hypothyroidism, unspecified: Secondary | ICD-10-CM | POA: Diagnosis not present

## 2024-06-21 DIAGNOSIS — M79661 Pain in right lower leg: Secondary | ICD-10-CM | POA: Diagnosis not present

## 2024-06-21 DIAGNOSIS — I8311 Varicose veins of right lower extremity with inflammation: Secondary | ICD-10-CM | POA: Diagnosis not present

## 2024-06-21 DIAGNOSIS — I8312 Varicose veins of left lower extremity with inflammation: Secondary | ICD-10-CM | POA: Diagnosis not present

## 2024-06-21 DIAGNOSIS — I83893 Varicose veins of bilateral lower extremities with other complications: Secondary | ICD-10-CM | POA: Diagnosis not present

## 2024-07-20 DIAGNOSIS — E039 Hypothyroidism, unspecified: Secondary | ICD-10-CM | POA: Diagnosis not present

## 2024-08-13 DIAGNOSIS — I83891 Varicose veins of right lower extremities with other complications: Secondary | ICD-10-CM | POA: Diagnosis not present

## 2024-08-20 DIAGNOSIS — I872 Venous insufficiency (chronic) (peripheral): Secondary | ICD-10-CM | POA: Diagnosis not present

## 2024-08-20 DIAGNOSIS — I83892 Varicose veins of left lower extremities with other complications: Secondary | ICD-10-CM | POA: Diagnosis not present

## 2024-08-20 DIAGNOSIS — Z09 Encounter for follow-up examination after completed treatment for conditions other than malignant neoplasm: Secondary | ICD-10-CM | POA: Diagnosis not present

## 2024-09-03 DIAGNOSIS — Z09 Encounter for follow-up examination after completed treatment for conditions other than malignant neoplasm: Secondary | ICD-10-CM | POA: Diagnosis not present

## 2024-09-03 DIAGNOSIS — I87392 Chronic venous hypertension (idiopathic) with other complications of left lower extremity: Secondary | ICD-10-CM | POA: Diagnosis not present

## 2024-09-12 ENCOUNTER — Other Ambulatory Visit: Payer: Self-pay | Admitting: Family Medicine

## 2024-09-12 DIAGNOSIS — Z1231 Encounter for screening mammogram for malignant neoplasm of breast: Secondary | ICD-10-CM

## 2025-03-01 ENCOUNTER — Ambulatory Visit
